# Patient Record
Sex: Male | Born: 2000 | Hispanic: Yes | Marital: Single | Smoking: Never smoker
Health system: Southern US, Community
[De-identification: ages and names within clinical notes are randomized; demographics above are authoritative.]

---

## 2019-06-30 ENCOUNTER — Other Ambulatory Visit: Payer: Self-pay

## 2019-06-30 ENCOUNTER — Emergency Department (INDEPENDENT_AMBULATORY_CARE_PROVIDER_SITE_OTHER): Payer: Medicaid Other

## 2019-06-30 ENCOUNTER — Emergency Department (INDEPENDENT_AMBULATORY_CARE_PROVIDER_SITE_OTHER)
Admission: EM | Admit: 2019-06-30 | Discharge: 2019-06-30 | Disposition: A | Discharge status: Home / Self Care | Payer: Medicaid Other | Source: Home / Self Care

## 2019-06-30 ENCOUNTER — Encounter: Payer: Self-pay | Admitting: Emergency Medicine

## 2019-06-30 DIAGNOSIS — M79652 Pain in left thigh: Secondary | ICD-10-CM | POA: Diagnosis not present

## 2019-06-30 DIAGNOSIS — M25562 Pain in left knee: Secondary | ICD-10-CM | POA: Diagnosis not present

## 2019-06-30 DIAGNOSIS — M25462 Effusion, left knee: Secondary | ICD-10-CM | POA: Diagnosis not present

## 2019-06-30 NOTE — ED Provider Notes (Signed)
[facility]    CSN: [id number] Arrival date & time: 06/30/19  1212      History   Chief Complaint Chief Complaint  Patient presents with  . Knee Injury    left    HPI Edward Collier is a 19 y.o. male.   HPI  Edward Collier is a 19 y.o. male presenting to UC with c/o knee pain that started 4:30AM on Tuesday, 3/30, during physical training while at [facility].  He reports hearing a "pop" and has had mild swelling and soreness above his kneecap since then.  He feels like his knee is going to "give way" at times.  He was prescribed mobic at school, last dose last night.  He is requesting x-rays today.     History reviewed. No pertinent past medical history.  There are no problems to display for this patient.   History reviewed. No pertinent surgical history.     Home Medications    Prior to Admission medications   Not on File    Family History Family History  Problem Relation Age of Onset  . Healthy Mother   . Healthy Father   . Healthy Brother     Social History Social History   Tobacco Use  . Smoking status: Never Smoker  . Smokeless tobacco: Never Used  Substance Use Topics  . Alcohol use: Never  . Drug use: Never     Allergies   Patient has no known allergies.   Review of Systems Review of Systems  Musculoskeletal: Positive for arthralgias, gait problem and joint swelling.  Skin: Negative for color change and wound.  Neurological: Negative for weakness and numbness.     Physical Exam Triage Vital Signs ED Triage Vitals  Enc Vitals Group     BP 06/30/19 1243 (!) 166/82     Pulse Rate 06/30/19 1243 75     Resp 06/30/19 1243 15     Temp 06/30/19 1243 98.1 F (36.7 C)     Temp Source 06/30/19 1243 Oral     SpO2 06/30/19 1243 96 %     Weight 06/30/19 1245 195 lb (88.5 kg)     Height 06/30/19 1245 6\' 1"  (1.854 m)     Head Circumference --      Peak Flow --      Pain Score 06/30/19 1244 3     Pain Loc --    Pain Edu? --      Excl. in [place]? --    No data found.  Updated Vital Signs BP (!) 166/82 (BP Location: Left Arm)   Pulse 75   Temp 98.1 F (36.7 C) (Oral)   Resp 15   Ht 6\' 1"  (1.854 m)   Wt 195 lb (88.5 kg)   SpO2 96%   BMI 25.73 kg/m   Visual Acuity Right Eye Distance:   Left Eye Distance:   Bilateral Distance:    Right Eye Near:   Left Eye Near:    Bilateral Near:     Physical Exam Vitals and nursing note reviewed.  Constitutional:      Appearance: Normal appearance. He is well-developed.  HENT:     Head: Normocephalic and atraumatic.  Cardiovascular:     Rate and Rhythm: Normal rate.  Pulmonary:     Effort: Pulmonary effort is normal.  Musculoskeletal:        General: Swelling and tenderness present. Normal range of motion.     Cervical back: Normal range of motion.  Comments: Left knee: mild suprapatellar edema with tenderness. Mild crepitus. Full ROM. No bony tenderness.   Skin:    General: Skin is warm and dry.     Findings: No bruising or erythema.  Neurological:     Mental Status: He is alert and oriented to person, place, and time.  Psychiatric:        Behavior: Behavior normal.      UC Treatments / Results  Labs (all labs ordered are listed, but only abnormal results are displayed) Labs Reviewed - No data to display  EKG   Radiology DG Knee Complete 4 Views Left  Result Date: 06/30/2019 CLINICAL DATA:  Left knee pain and swelling after knee buckled while running. EXAM: LEFT KNEE - COMPLETE 4+ VIEW COMPARISON:  None. FINDINGS: No acute fracture or dislocation. Small joint effusion. Joint spaces are preserved. Bone mineralization is normal. Soft tissues are unremarkable. IMPRESSION: 1. Small joint effusion.  No acute osseous abnormality. Electronically Signed   By: Obie Dredge M.D.   On: 06/30/2019 13:28    Procedures Procedures (including critical care time)  Medications Ordered in UC Medications - No data to display  Initial  Impression / Assessment and Plan / UC Course  I have reviewed the triage vital signs and the nursing notes.  Pertinent labs & imaging results that were available during my care of the patient were reviewed by me and considered in my medical decision making (see chart for details).     Discussed imaging with pt Knee brace and crutches provided F/u with Sports Medicine next week AVS provided  Final Clinical Impressions(s) / UC Diagnoses   Final diagnoses:  Left thigh pain  Acute pain of left knee  Knee effusion, left     Discharge Instructions      You may wear the brace for comfort and stability. Elevate and apply a cool compress 2-3 times daily to help with pain and swelling. You may take Tylenol and Motrin as needed for pain and swelling.  Call Monday to schedule a follow up appointment with Dr. Benjamin Stain, Sports Medicine, for further evaluation and treatment of your knee symptoms.      ED Prescriptions    None     PDMP not reviewed this encounter.   Edward Collier, [place] 06/30/19 1422

## 2019-06-30 NOTE — ED Triage Notes (Signed)
Injured left knee during PT test at 0430  Felt it pop - now limping Given mobic [facility] - [facility] - mobic last night Ice and elevation until today

## 2019-06-30 NOTE — Discharge Instructions (Signed)
  You may wear the brace for comfort and stability. Elevate and apply a cool compress 2-3 times daily to help with pain and swelling. You may take Tylenol and Motrin as needed for pain and swelling.  Call Monday to schedule a follow up appointment with Dr. Benjamin Stain, Sports Medicine, for further evaluation and treatment of your knee symptoms.

## 2019-07-03 ENCOUNTER — Telehealth: Payer: Self-pay

## 2019-07-03 NOTE — Telephone Encounter (Signed)
Pts mother called to ask about follow up with Dr T. Questions about possible virtual visit since pt is at college at [employer] [facility]. Gave number to set up follow up appt and direct questions to follow up visit.

## 2021-07-19 IMAGING — DX DG KNEE COMPLETE 4+V*L*
4 series · 4 of 4 positions shown · non-contrast
Comparison: None.

CLINICAL DATA: Left knee pain and swelling after knee buckled while
running.

EXAM:
LEFT KNEE - COMPLETE 4+ VIEW

[knee ap]
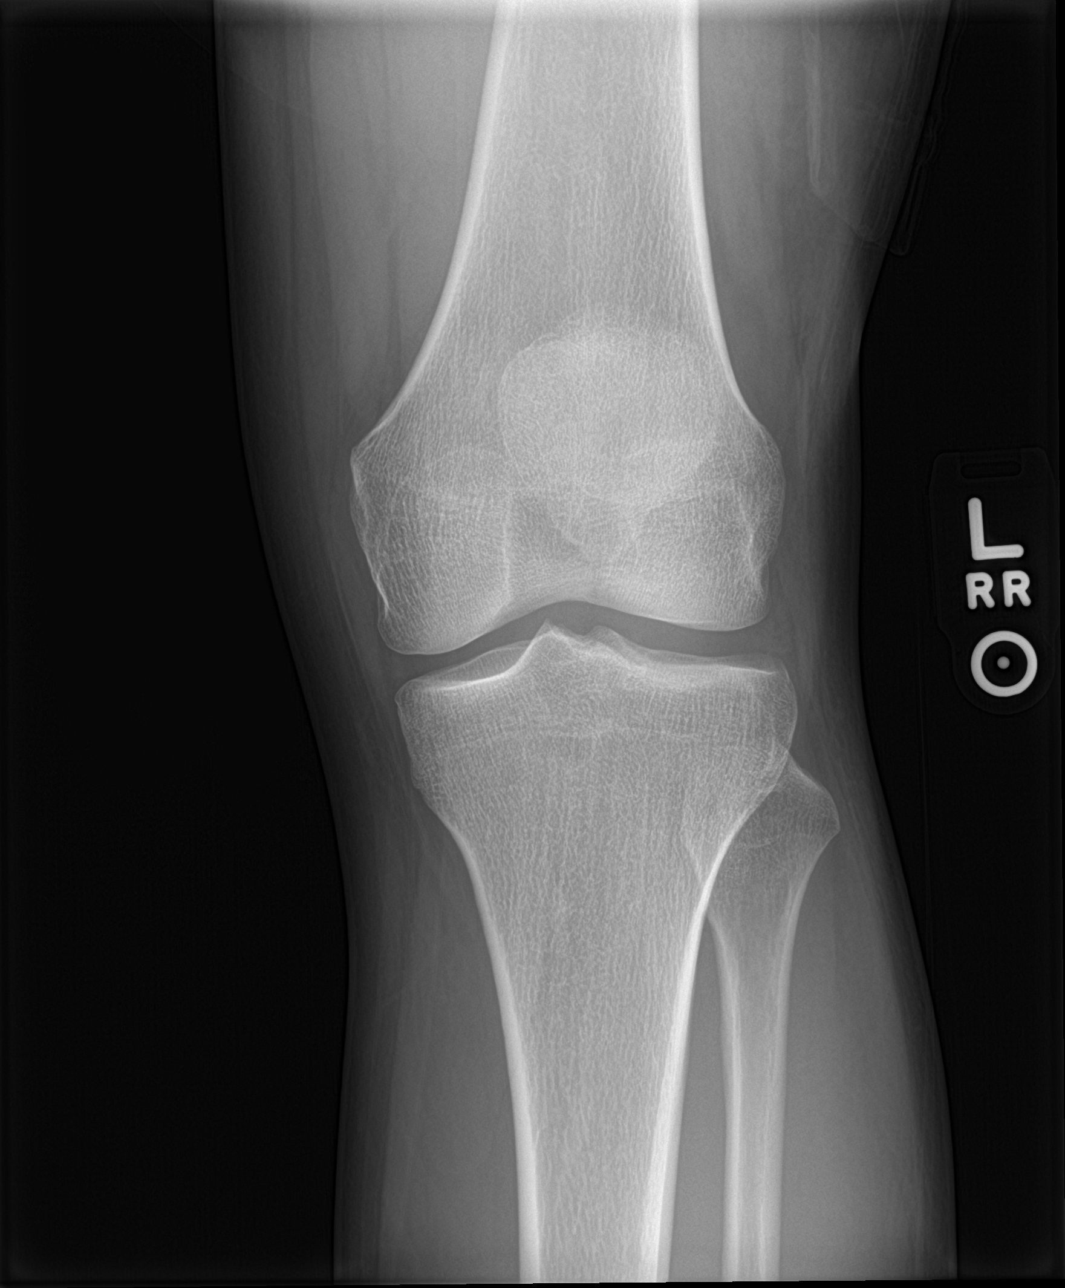

[knee lat]
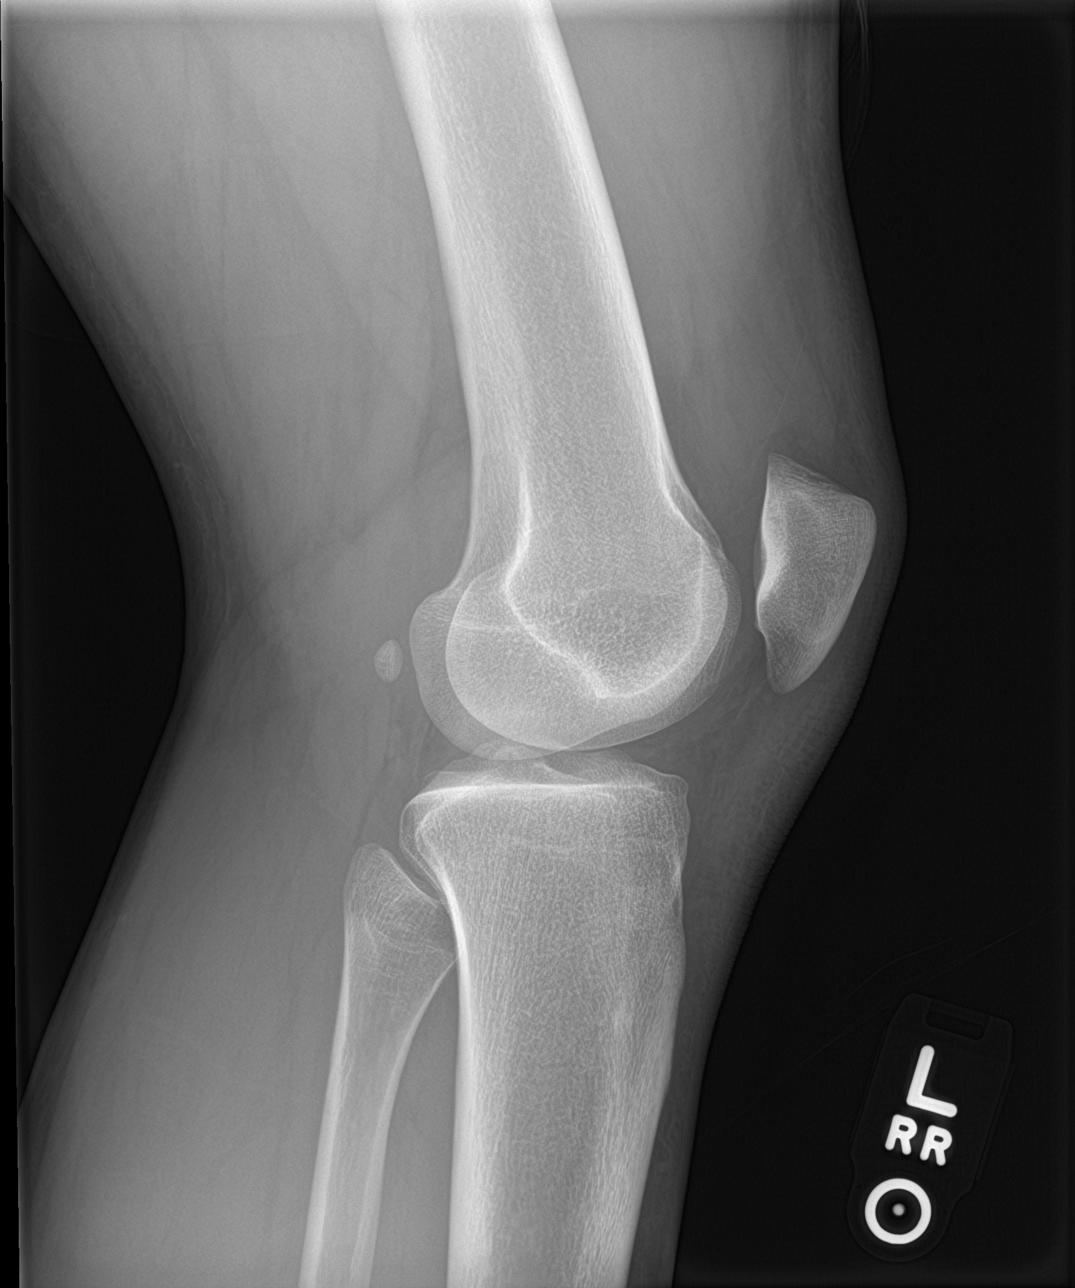

[knee obl (1 of 2)]
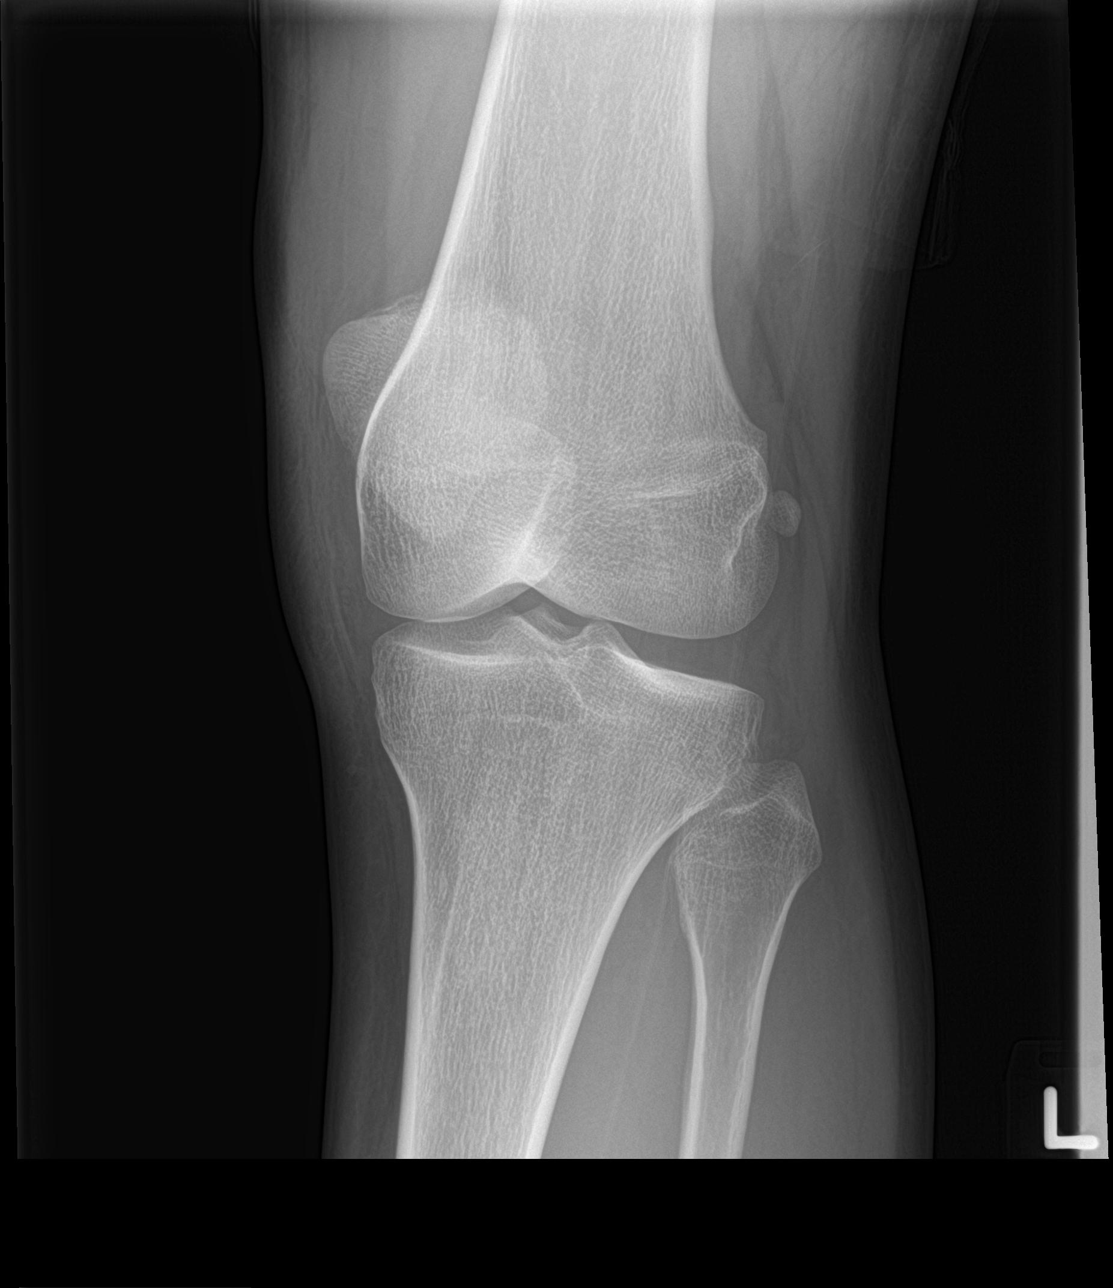

[knee obl (2 of 2)]
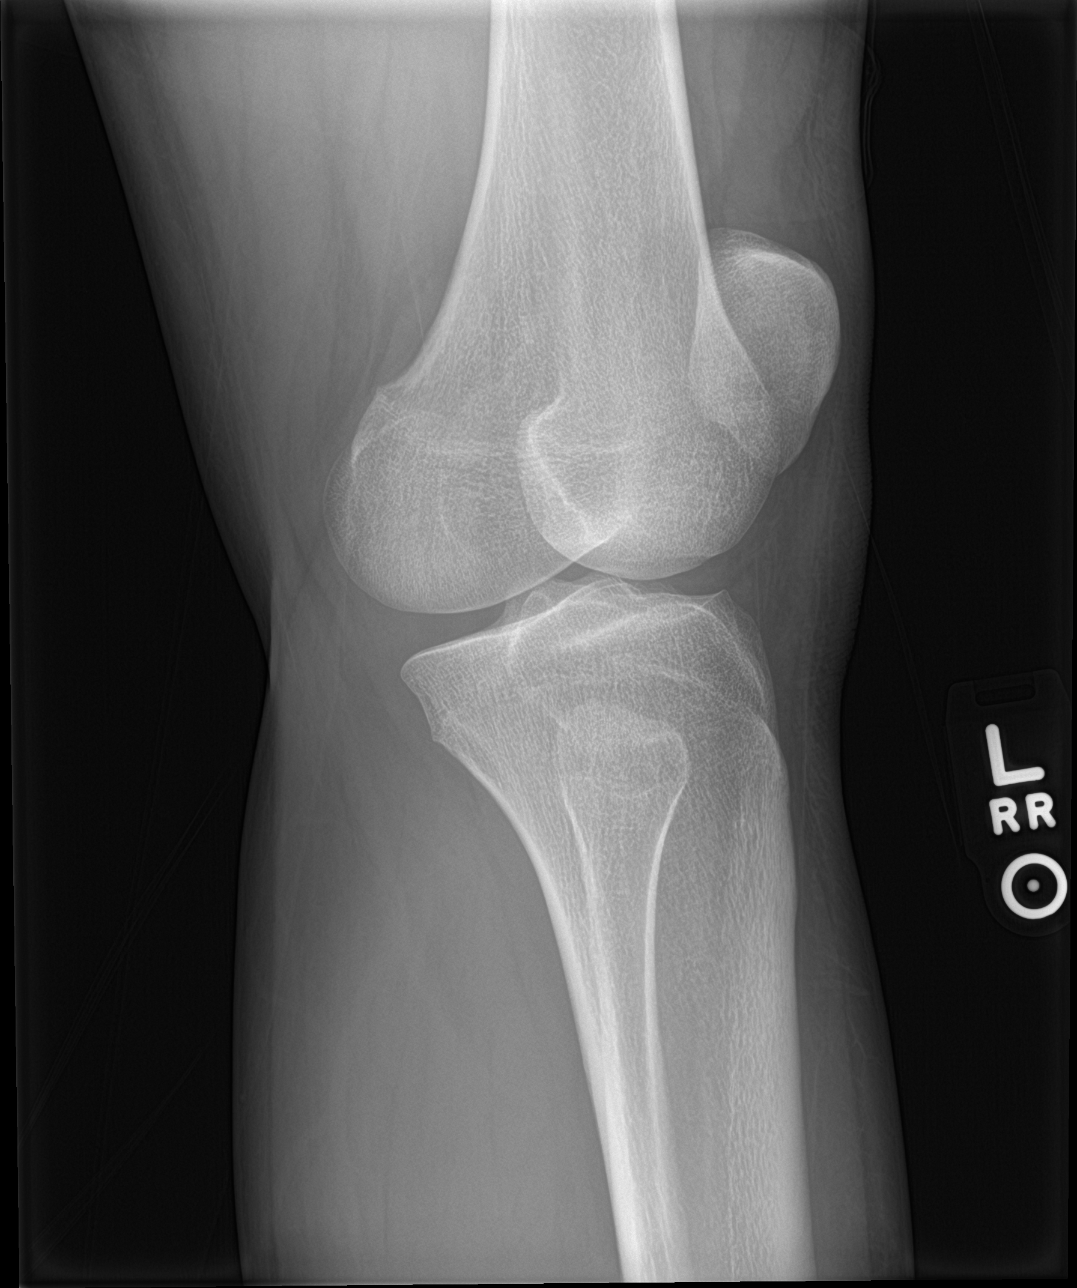

[4 of 4 positions shown; findings below may reference images not displayed]

FINDINGS: No acute fracture or dislocation. Small joint effusion. Joint spaces
are preserved. Bone mineralization is normal. Soft tissues are
unremarkable.
IMPRESSION: 1. Small joint effusion.  No acute osseous abnormality.
